# Patient Record
Sex: Male | Born: 1965 | Race: White | Hispanic: No | Marital: Single | Smoking: Never smoker
Health system: Southern US, Community
[De-identification: ages and names within clinical notes are randomized; demographics above are authoritative.]

## PROBLEM LIST (undated history)

## (undated) HISTORY — PX: KNEE SURGERY: SHX244

---

## 2016-08-03 ENCOUNTER — Emergency Department (HOSPITAL_BASED_OUTPATIENT_CLINIC_OR_DEPARTMENT_OTHER)
Admission: EM | Admit: 2016-08-03 | Discharge: 2016-08-03 | Disposition: A | Discharge status: Home / Self Care | Payer: 59 | Attending: Emergency Medicine | Admitting: Emergency Medicine

## 2016-08-03 ENCOUNTER — Encounter (HOSPITAL_BASED_OUTPATIENT_CLINIC_OR_DEPARTMENT_OTHER): Payer: Self-pay | Admitting: Emergency Medicine

## 2016-08-03 DIAGNOSIS — H5462 Unqualified visual loss, left eye, normal vision right eye: Secondary | ICD-10-CM | POA: Insufficient documentation

## 2016-08-03 DIAGNOSIS — H53132 Sudden visual loss, left eye: Secondary | ICD-10-CM

## 2016-08-03 DIAGNOSIS — H5712 Ocular pain, left eye: Secondary | ICD-10-CM | POA: Diagnosis present

## 2016-08-03 MED ORDER — TETRACAINE HCL 0.5 % OP SOLN
2.0000 [drp] | Freq: Once | OPHTHALMIC | Status: AC
  Administered 2016-08-03: 2 [drp] via OPHTHALMIC
  Filled 2016-08-03: qty 4

## 2016-08-03 NOTE — ED Triage Notes (Signed)
Pt in c/o recent flu-like illness x 2 week ago. States onset 2 days ago noticed a dot in his right eye visual field, states it is dark. [facility] and told to follow up with opthalmologist, or come to ED if worse and today was worse. Pt is alert, interactive, neuro intact, ambulatory in NAD.

## 2016-08-03 NOTE — ED Provider Notes (Signed)
[facility] Provider Note   CSN: [id number] Arrival date & time: 08/03/16  1403   By signing my name below, I, Teofilo Pod, attest that this documentation has been prepared under the direction and in the presence of Alvira Monday, MD . Electronically Signed: Teofilo Pod, ED Scribe. 08/03/2016. 3:42 PM.   History   Chief Complaint Chief Complaint  Patient presents with  . Visual Field Change    The history is provided by the patient. No language interpreter was used.   HPI Comments:  Darren Nguyen is a 50 y.o. male who presents to the Emergency Department complaining of worsening visual field change x 2 days. Pt states that 2 days ago he noticed a dark dot in his left eye's visual field. Pt reports that 1 week ago he began to have a fever that was up to 104.7 and went to [place]. Pt states that he was diagnosed with some unknown type of virus. 3 days later the fever was resolved, but then he went back to [facility] 3 days ago after noticing his visual field change, was referred to an ophthalmologist, and was told to come to the ED if it got worse. Pt states that the dark spot in his vision is not constantly in this same place and has become "darker" since onset. Pt reports that 2 days ago he saw a flash of light coming from water drops in the tub that were not really there. Pt complains of associated left eye pain like pressure behind the eye, mild headache, rhinorrhea, and occasional numbness of fingers and toes. Pt reports PFHx of multiple sclerosis (sister). Daughter with hx of optic nerve tumor. No alleviating factors noted. Pt denies eye trauma. Pt denies any current fever, double vision, facial droop, nausea, vomiting.     History reviewed. No pertinent past medical history.  There are no active problems to display for this patient.   Past Surgical History:  Procedure Laterality Date  . KNEE SURGERY         Home Medications    Prior to Admission  medications   Not on File    Family History History reviewed. No pertinent family history.  Social History Social History  Substance Use Topics  . Smoking status: Never Smoker  . Smokeless tobacco: Not on file  . Alcohol use No     Allergies   Review of patient's allergies indicates no known allergies.   Review of Systems Review of Systems  Constitutional: Negative for fever.  HENT: Positive for rhinorrhea. Negative for sore throat.   Eyes: Positive for pain and visual disturbance.  Respiratory: Negative for shortness of breath.   Cardiovascular: Negative for chest pain.  Gastrointestinal: Negative for abdominal pain, nausea and vomiting.  Genitourinary: Negative for difficulty urinating.  Musculoskeletal: Negative for back pain and neck stiffness.  Skin: Negative for rash.  Neurological: Positive for numbness and headaches. Negative for syncope, facial asymmetry, speech difficulty and weakness.  All other systems reviewed and are negative.    Physical Exam Updated Vital Signs BP 129/91   Pulse 86   Temp 98.3 F (36.8 C) (Oral)   Resp 17   Ht 5\' 9"  (1.753 m)   Wt 225 lb (102.1 kg)   SpO2 98%   BMI 33.23 kg/m   Physical Exam  Constitutional: He appears well-developed and well-nourished.  HENT:  Head: Normocephalic and atraumatic.  Eyes: Conjunctivae are normal.  IOP are 17 right eye and 22 left eye Left pupil  with minimal constriction to direct light, normal consensual constriction  Neck: Neck supple.  Cardiovascular: Normal rate and regular rhythm.   No murmur heard. Pulmonary/Chest: Effort normal and breath sounds normal. No respiratory distress.  Abdominal: Soft. There is no tenderness.  Musculoskeletal: He exhibits no edema.  Neurological: He is alert.  Skin: Skin is warm and dry.  Psychiatric: He has a normal mood and affect.  Nursing note and vitals reviewed.     Visual Acuity  Right Eye Distance: 20/25 Left Eye Distance: 20/200 Bilateral  Distance: 20/50  Right Eye Near:   Left Eye Near:    Bilateral Near:     ED Treatments / Results  DIAGNOSTIC STUDIES:  Oxygen Saturation is 98% on RA, normal by my interpretation.    COORDINATION OF CARE:  3:42 PM Discussed treatment plan with pt at bedside and pt agreed to plan.   Labs (all labs ordered are listed, but only abnormal results are displayed) Labs Reviewed - No data to display  EKG  EKG Interpretation None       Radiology No results found.  Procedures Procedures (including critical care time)  Medications Ordered in ED Medications  tetracaine (PONTOCAINE) 0.5 % ophthalmic solution 2 drop (2 drops Both Eyes Given by Other 08/03/16 1558)     Initial Impression / Assessment and Plan / ED Course  I have reviewed the triage vital signs and the nursing notes.  Pertinent labs & imaging results that were available during my care of the patient were reviewed by me and considered in my medical decision making (see chart for details).  Clinical Course    50 year old male with no significant medical history presents with concern for visual changes. Patient reports symptoms began on Friday as a visual field deficit, and since that time has been progressing, with worsening today. Denies a constant visual field deficit, however reports sometimes he is unable to see towards the side, sometimes he is unable to see superiorly and sometimes inferiorly.  Patient reports he is able to see things a certain distance away from his face in the central part of his vision, however has no peripheral vision.  When visual acuity was tested, was found to be 20/200 in comparison to 20/25. He has loss of peripheral vision on my exam in all directions involving his left eye. He also has an afferent pupillary defect.  Eye pressures were checked and normal. Patient with normal ROM of eyes, no proptosis, no periorbital cellulitis, afebrile, otherwise normal neurologic exam at this time, and  have low suspicion for cavernous sinus thrombosis.   Given symptoms affecting just the left eye, differential includes retinal pathology, including CRAO, CRVO, detachment, optic neuritis, other optic nerve tumor.  Patient without known embolic risk factors. Given family hx of MS in sister, afferent pupillary defect, suspect optic neuritis. Daughter with hx of optic nerve tumor. Discussed with Dr. Allena Katz of Ophthalmology who recommends follow up with him in the morning and from there he will recommend further evaluation (Dr. Allena Katz discussed favoring close follow up tomorrow over transfer for MR at this time and will recommend MR if indicated tom.)  Patient does not have other neuro symptoms at this time and given close follow up, preference for MR over CT for imaging feel this can be deferred until after Ophthalmology evaluation.  Recommend establishing PCP, and provided number for Neurology in addition to Ophthalmology follow up tomorrow.   Patient to follow up with Dr. Allena Katz tomorrow AM.  Final Clinical Impressions(s) /  ED Diagnoses   Final diagnoses:  Acute visual loss, left, concern for optic neuritis    New Prescriptions There are no discharge medications for this patient. I personally performed the services described in this documentation, which was scribed in my presence. The recorded information has been reviewed and is accurate.     Alvira MondayErin Shanya Ferriss, MD 08/04/16 [phone]

## 2016-08-03 NOTE — ED Notes (Signed)
Pt reports that he forgot to tell us that he had cellulitis on his face in 2015- MD made aware.

## 2016-08-03 NOTE — ED Notes (Signed)
Dr. Clarene DukeLittle notified of pt's eye assessment and visual acuity results- no new orders received at this time.

## 2016-08-03 NOTE — ED Notes (Signed)
Pt states since Friday his Lt eye has blurred vision and dark, pt denies floaters.  Pt denies dizziness and lightheadedness.

## 2016-08-06 ENCOUNTER — Other Ambulatory Visit: Payer: Self-pay | Admitting: Ophthalmology

## 2016-08-06 DIAGNOSIS — H53419 Scotoma involving central area, unspecified eye: Secondary | ICD-10-CM

## 2016-08-19 ENCOUNTER — Other Ambulatory Visit: Payer: 59

## 2016-08-20 ENCOUNTER — Ambulatory Visit
Admission: RE | Admit: 2016-08-20 | Discharge: 2016-08-20 | Disposition: A | Discharge status: Home / Self Care | Payer: 59 | Source: Ambulatory Visit | Attending: Ophthalmology | Admitting: Ophthalmology

## 2016-08-20 DIAGNOSIS — H53419 Scotoma involving central area, unspecified eye: Secondary | ICD-10-CM

## 2016-08-20 MED ORDER — GADOBENATE DIMEGLUMINE 529 MG/ML IV SOLN
20.0000 mL | Freq: Once | INTRAVENOUS | Status: AC | PRN
  Administered 2016-08-20: 20 mL via INTRAVENOUS

## 2017-03-25 IMAGING — MR MR ORBITS WO/W CM
12 of 16 series · 28 of 48 positions shown · IV contrast (20ml Multihance)
Comparison: None.

CLINICAL DATA: Episode of left eye visual disturbance last week.
Family history of multiple sclerosis.

EXAM:
MRI HEAD AND ORBITS WITHOUT AND WITH CONTRAST
TECHNIQUE: Multiplanar, multiecho pulse sequences of the brain and surrounding
structures were obtained without and with intravenous contrast.
Multiplanar, multiecho pulse sequences of the orbits and surrounding
structures were obtained including fat saturation techniques, before
and after intravenous contrast administration.
CONTRAST:  20mL MULTIHANCE GADOBENATE DIMEGLUMINE 529 MG/ML IV SOLN

[Series 2: T1 · sagittal · 5.0mm · 0.45mm/px · 3 of 20 slices shown]
[im 1/20]
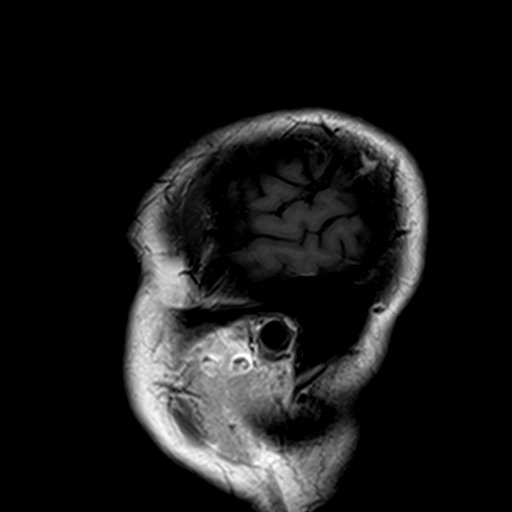
[im 10/20]
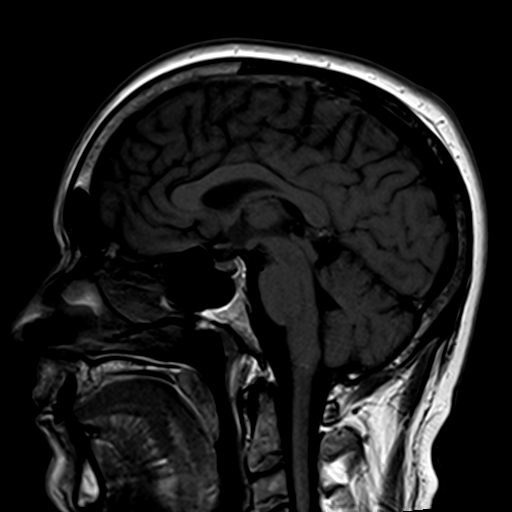
[im 20/20]
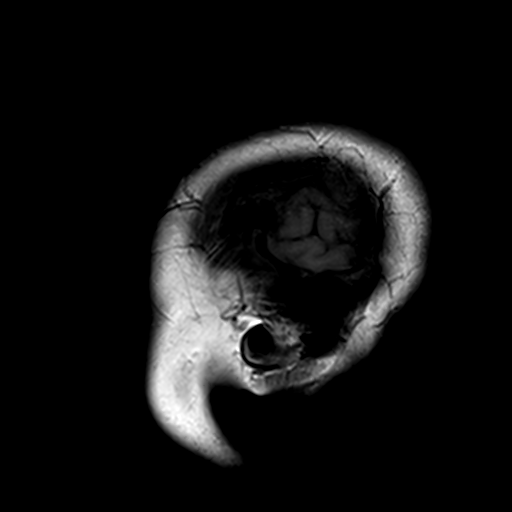

[Series 3: ep2d_diff_(id)_trace · axial · 5.0mm · 1.80mm/px · z∈[-54,+82]mm · 4 of 44 slices shown]
[im 1/44]
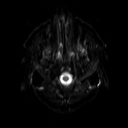
[im 15/44]
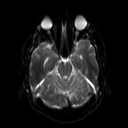
[im 29/44]
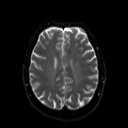
[im 44/44]
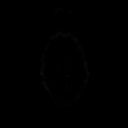

[Series 4: ep2d_diff_(id)_trace_adc · axial · 5.0mm · 1.80mm/px · z∈[-54,+82]mm · 2 of 22 slices shown]
[im 1/22]
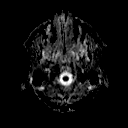
[im 22/22]
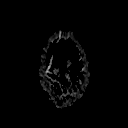

[Series 5: t2_tse_tra · axial · 5.0mm · 0.72mm/px · z∈[-57,+73]mm · 2 of 21 slices shown]
[im 1/21]
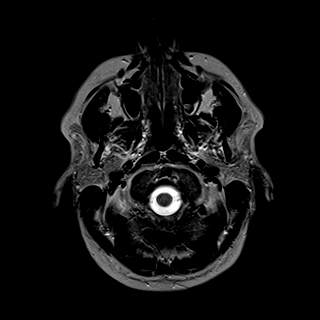
[im 21/21]
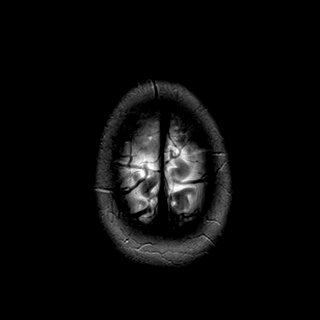

[Series 6: FLAIR · axial · 5.0mm · 0.45mm/px · z∈[-57,+73]mm · 2 of 21 slices shown]
[im 1/21]
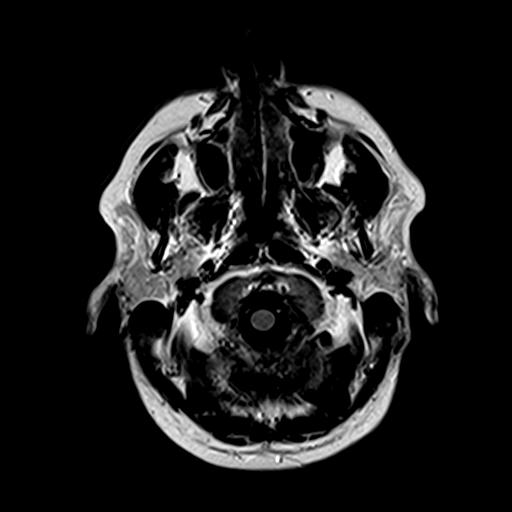
[im 21/21]
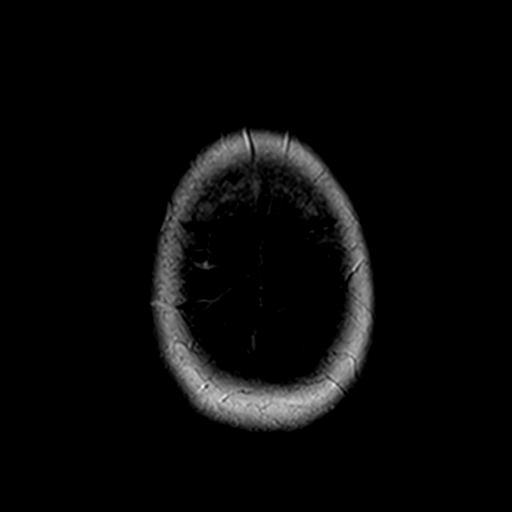

[Series 7: t1_tse cor_3mm · coronal · 3.0mm · 0.35mm/px · 3 of 26 slices shown]
[im 1/26]
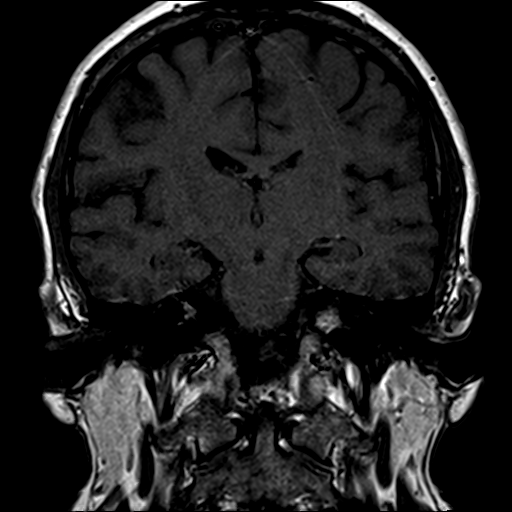
[im 13/26]
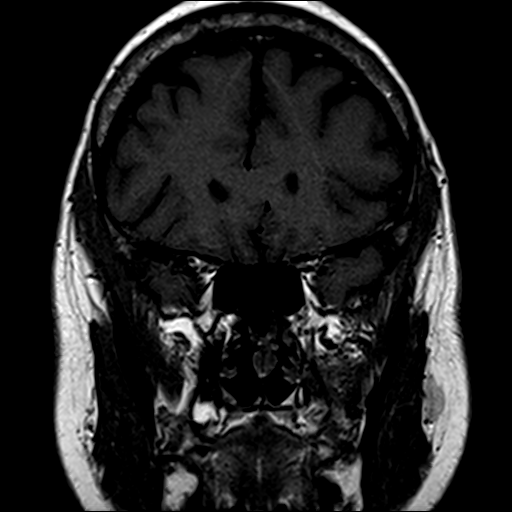
[im 26/26]
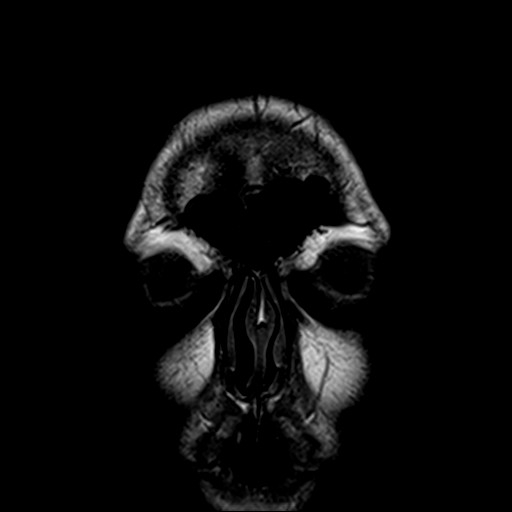

[Series 8: t1_tse axial_3mm · axial · 3.0mm · 0.35mm/px · z∈[-39,+16]mm · 2 of 19 slices shown]
[im 1/19]
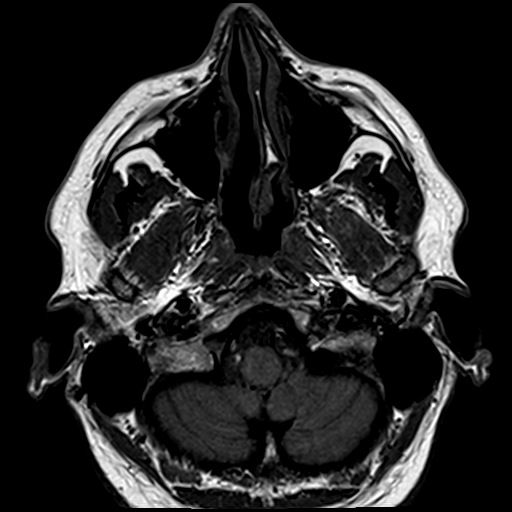
[im 19/19]
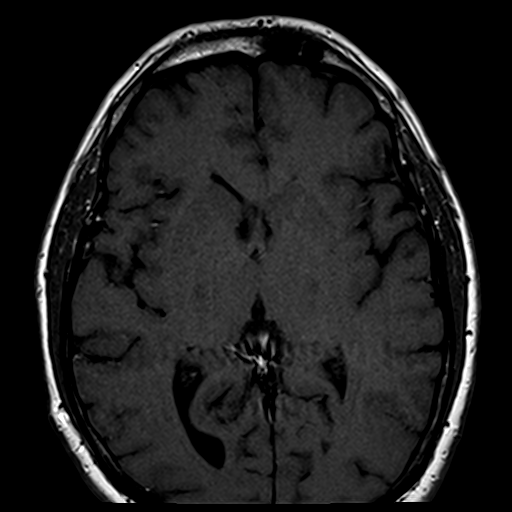

[Series 9: GRE · axial · 5.0mm · 0.45mm/px · z∈[-59,+76]mm · 2 of 22 slices shown]
[im 1/22]
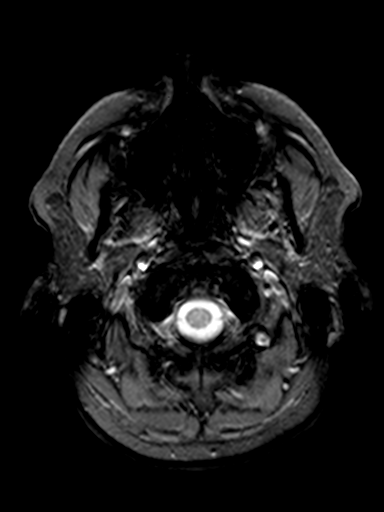
[im 22/22]
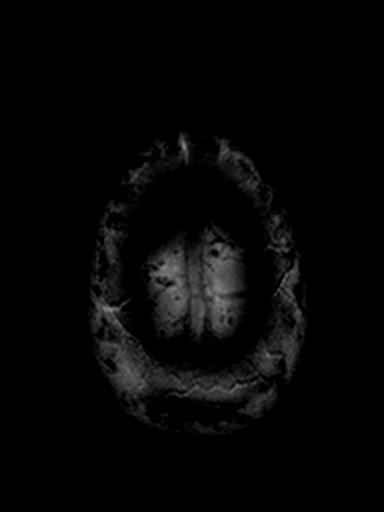

[Series 10: t2_cor_fs_3mm orbits · coronal · 3.0mm · 0.70mm/px · 3 of 26 slices shown]
[im 1/26]
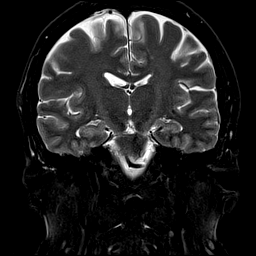
[im 13/26]
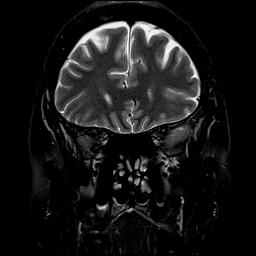
[im 26/26]
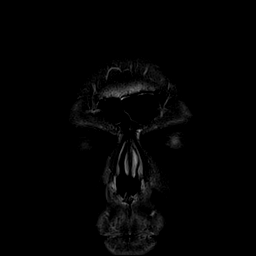

[Series 11: t2_ax_fs_3mm orbits · axial · 3.0mm · 0.35mm/px · z∈[-39,+16]mm · 2 of 19 slices shown]
[im 1/19]
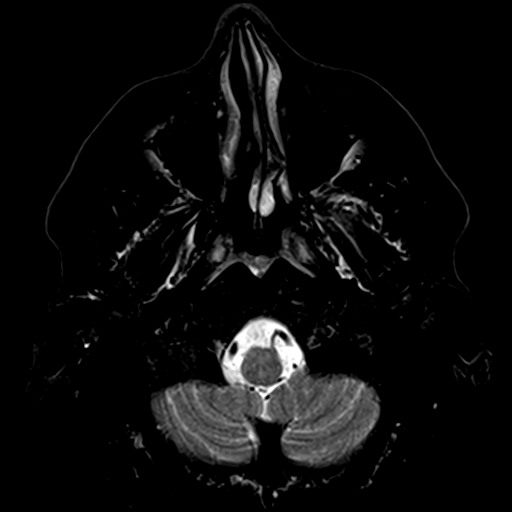
[im 19/19]
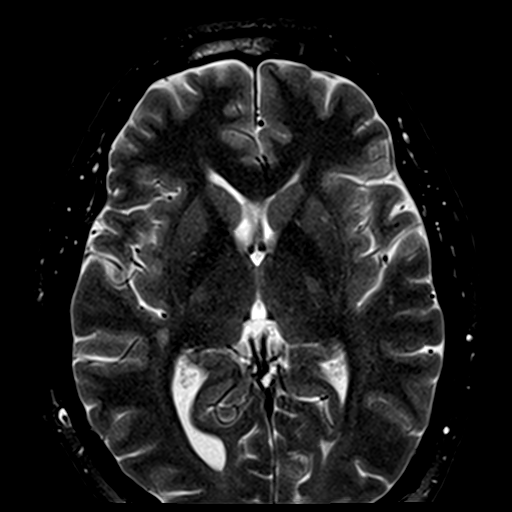

[Series 12: t1_mpr_tra · axial · 2.0mm · 0.45mm/px · 1 of 72 slices shown]
[im 1/72]
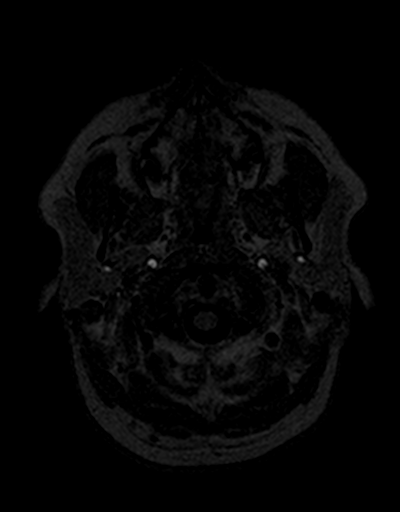

[Series 13: T2 · coronal · 5.0mm · 0.45mm/px · 2 of 24 slices shown]
[im 1/24]
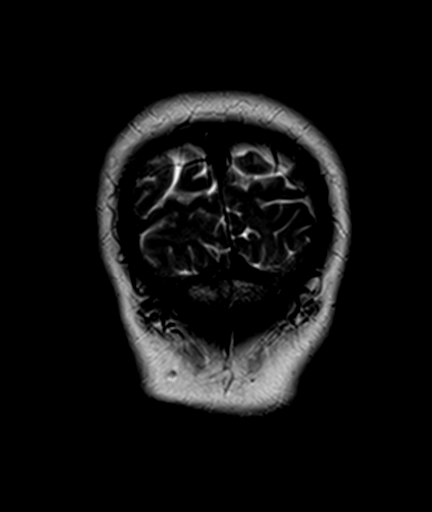
[im 24/24]
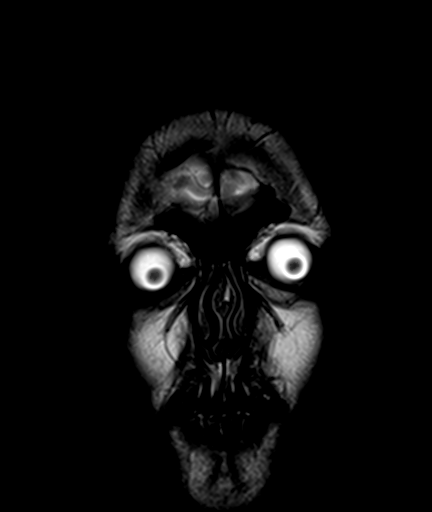

[28 of 48 positions shown; findings below may reference images not displayed]

FINDINGS: MRI HEAD FINDINGS

Brain: 2 foci of FLAIR hyperintensity the periventricular right
temporal and juxtacortical posterior right frontal white matter. No
infratentorial signal abnormality. No specific demyelinating pattern
for multiple sclerosis. No normal brain volume. No acute or remote
infarct, hemorrhage, hydrocephalus, or mass. Asymmetric lateral
ventricular volume is considered developmental.

Vascular: Normal flow voids.

Skull and upper cervical spine: Normal marrow signal.

MRI ORBITS FINDINGS

Orbits: There is avid asymmetric enhancement of the left optic
nerve, retro bulbar. No optic nerve atrophy. Normal chiasm and
suprasellar cistern. Normal globes, lacrimal glands, and extraocular
muscles.

Visualized sinuses: Negative

Soft tissues: Negative

It is unclear if the patient is already under treatment for optic
neuritis. To ensure expeditious therapy, these results will be
called to the ordering clinician or representative by the
Radiologist Assistant, and communication documented in the PACS or
zVision Dashboard.
IMPRESSION: 1. Left optic neuritis.
2. Only 2 cerebral white matter hyperintensities. No specific
demyelinating pattern in the brain.
